# Patient Record
Sex: Male | Born: 2005 | Race: White | Hispanic: No | Marital: Single | Smoking: Never smoker
Health system: Southern US, Community
[De-identification: ages and names within clinical notes are randomized; demographics above are authoritative.]

## PROBLEM LIST (undated history)

## (undated) DIAGNOSIS — F909 Attention-deficit hyperactivity disorder, unspecified type: Secondary | ICD-10-CM

## (undated) HISTORY — DX: Attention-deficit hyperactivity disorder, unspecified type: F90.9

---
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
## 2023-09-29 DIAGNOSIS — K047 Periapical abscess without sinus: Secondary | ICD-10-CM | POA: Diagnosis not present
